# Patient Record
Sex: Female | Born: 1995 | Race: Black or African American | Hispanic: No | Smoking: Former smoker
Health system: Southern US, Community
[De-identification: ages and names within clinical notes are randomized; demographics above are authoritative.]

## PROBLEM LIST (undated history)

## (undated) DIAGNOSIS — J45909 Unspecified asthma, uncomplicated: Secondary | ICD-10-CM

---
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
## 2013-12-27 ENCOUNTER — Other Ambulatory Visit: Payer: Self-pay | Admitting: Internal Medicine

## 2013-12-27 ENCOUNTER — Ambulatory Visit
Admission: RE | Admit: 2013-12-27 | Discharge: 2013-12-27 | Disposition: A | Discharge status: Home / Self Care | Payer: Federal, State, Local not specified - PPO | Source: Ambulatory Visit | Attending: Internal Medicine | Admitting: Internal Medicine

## 2013-12-27 DIAGNOSIS — R109 Unspecified abdominal pain: Secondary | ICD-10-CM

## 2013-12-27 DIAGNOSIS — K219 Gastro-esophageal reflux disease without esophagitis: Secondary | ICD-10-CM

## 2013-12-28 ENCOUNTER — Ambulatory Visit
Admission: RE | Admit: 2013-12-28 | Discharge: 2013-12-28 | Disposition: A | Discharge status: Home / Self Care | Payer: Federal, State, Local not specified - PPO | Source: Ambulatory Visit | Attending: Internal Medicine | Admitting: Internal Medicine

## 2013-12-28 DIAGNOSIS — R109 Unspecified abdominal pain: Secondary | ICD-10-CM

## 2013-12-28 DIAGNOSIS — K219 Gastro-esophageal reflux disease without esophagitis: Secondary | ICD-10-CM

## 2014-10-13 ENCOUNTER — Other Ambulatory Visit: Payer: Self-pay | Admitting: Obstetrics and Gynecology

## 2014-10-13 DIAGNOSIS — E049 Nontoxic goiter, unspecified: Secondary | ICD-10-CM

## 2014-10-19 ENCOUNTER — Other Ambulatory Visit: Payer: Federal, State, Local not specified - PPO

## 2015-02-02 ENCOUNTER — Ambulatory Visit (INDEPENDENT_AMBULATORY_CARE_PROVIDER_SITE_OTHER): Payer: Federal, State, Local not specified - PPO | Admitting: Neurology

## 2015-02-02 ENCOUNTER — Encounter: Payer: Self-pay | Admitting: Neurology

## 2015-02-02 VITALS — BP 112/75 | HR 83 | Ht 65.5 in | Wt 270.0 lb

## 2015-02-02 DIAGNOSIS — R519 Headache, unspecified: Secondary | ICD-10-CM

## 2015-02-02 DIAGNOSIS — R51 Headache: Secondary | ICD-10-CM | POA: Diagnosis not present

## 2015-02-02 MED ORDER — NORTRIPTYLINE HCL 10 MG PO CAPS: 20.0000 mg | ORAL_CAPSULE | Freq: Every day | ORAL | Status: AC

## 2015-02-02 MED ORDER — TIZANIDINE HCL 4 MG PO TABS: 4.0000 mg | ORAL_TABLET | Freq: Three times a day (TID) | ORAL | Status: AC | PRN

## 2015-02-02 NOTE — Progress Notes (Signed)
[facility]    Provider:  Dr Lucia GaskinsAhern Referring Provider: Jaymes Graffillard, Naima, MD Primary Care Physician:  No primary care provider on file.  CC:  headaches  HPI:  Candice Browning is a 19 y.o. female here as a referral from Dr. Normand Sloopillard for headaches  Headaches for years. Headaches are different places in the head, back of the head, behind eyes, temples. Depends. They are all different. Pressure behind the eyes, massaging the back of thead helps, has  tightness. Has them every day. Up to 24 hours a day. No time of day preference. These ar her normal headaches. No associated vision changes, or hearing changes. Not positional. She has to wear sunglasses outside. No sound sensitivity. No other focal neurologic deficits. Has gained 30 pounds in the last 6 months. Hard to get to sleep. Avg 6 hours. Worsening.  Review of Systems: Patient complains of symptoms per HPI as well as the following symptoms: swelling in legs, feeling hot, feeling thirsty, cramps, diarrhea, constipation, allergies, runny nose, depression, decreased energy, disinterest in activities. Pertinent negatives per HPI. All others negative.   History   Social History  . Marital Status: Unknown    Spouse Name: N/A  . Number of Children: 0  . Years of Education: Some colle   Occupational History  . Full time student    Social History Main Topics  . Smoking status: Former Smoker    Quit date: 04/06/2013  . Smokeless tobacco: Not on file  . Alcohol Use: 0.0 oz/week    0 Standard drinks or equivalent per week     Comment: 3-5 drinks weekly   . Drug Use: No  . Sexual Activity: Not on file   Other Topics Concern  . Not on file   Social History Narrative   Lives at college with two roommates   Caffeine use: 1-2 starbucks drinks daily     Family History  Problem Relation Age of Onset  . Diabetes    . Multiple sclerosis    . High blood pressure      History reviewed. No pertinent past medical  history.  History reviewed. No pertinent past surgical history.  Current Outpatient Prescriptions  Medication Sig Dispense Refill  . fluticasone (FLONASE) 50 MCG/ACT nasal spray Place 1 spray into both nostrils daily.    Marland Kitchen. Fexofenadine HCl (ALLEGRA PO) Take by mouth.    . Omeprazole (PRILOSEC PO) Take by mouth.     No current facility-administered medications for this visit.    Allergies as of 02/02/2015 - Review Complete 02/02/2015  Allergen Reaction Noted  . Iodine  02/02/2015  . Mold extract [trichophyton]  02/02/2015  . Other  02/02/2015  . Pollen extract  02/02/2015    Vitals: Ht 5' 5.5" (1.664 m)  Wt 270 lb (122.471 kg)  BMI 44.23 kg/m2 Last Weight:  Wt Readings from Last 1 Encounters:  02/02/15 270 lb (122.471 kg)   Last Height:   Ht Readings from Last 1 Encounters:  02/02/15 5' 5.5" (1.664 m)   Physical exam: Exam: Gen: NAD, conversant, well nourised, obese, well groomed                     CV: RRR, no MRG. No Carotid Bruits. No peripheral edema, warm, nontender Eyes: Conjunctivae clear without exudates or hemorrhage  Neuro: Detailed Neurologic Exam  Speech:    Speech is normal; fluent and spontaneous with normal comprehension.  Cognition:    The patient is oriented to person, place,  and time;     recent and remote memory intact;     language fluent;     normal attention, concentration,     fund of knowledge Cranial Nerves:    The pupils are equal, round, and reactive to light. The fundi are normal and spontaneous venous pulsations are present. Visual fields are full to finger confrontation. Extraocular movements are intact. Trigeminal sensation is intact and the muscles of mastication are normal. The face is symmetric. The palate elevates in the midline. Hearing intact. Voice is normal. Shoulder shrug is normal. The tongue has normal motion without fasciculations.   Coordination:    Normal finger to nose and heel to shin. Normal rapid alternating  movements.   Gait:    Heel-toe and tandem gait are normal.   Motor Observation:    No asymmetry, no atrophy, and no involuntary movements noted. Tone:    Normal muscle tone.    Posture:    Posture is normal. normal erect    Strength:    Strength is V/V in the upper and lower limbs.      Sensation: intact to LT     Reflex Exam:  DTR's:    Deep tendon reflexes in the upper and lower extremities are normal bilaterally.   Toes:    The toes are downgoing bilaterally.   Clonus:    Clonus is absent.       Assessment/Plan:  19 year old female with headaches. Chronic daily headaches. Neurologic exam normal. No other associated focal neurologic deficits. Morbid obesity.  Will start nortriptylline  qhs Tizanidine Both can be sedating, discussed risks, do not drive Advised follow up for depression Use birth control  Naomie Dean, MD  [facility] [address] [place], [place] [address]  Phone [phone] Fax [phone]

## 2015-02-02 NOTE — Patient Instructions (Signed)
Overall you are doing fairly well but I do want to suggest a few things today:   Remember to drink plenty of fluid, eat healthy meals and do not skip any meals. Try to eat protein with a every meal and eat a healthy snack such as fruit or nuts in between meals. Try to keep a regular sleep-wake schedule and try to exercise daily, particularly in the form of walking, 20-30 minutes a day, if you can.   As far as your medications are concerned, I would like to suggest:  Nortriptyline. Start with 10mg  at night, ca increase to 20mg  in 2 weeks if needed. Tizanidine as prescribed for headache or neck pain  As far as diagnostic testing: CT of the head  I would like to see you back in 3 months, sooner if we need to. Please call us with any interim questions, concerns, problems, updates or refill requests.   Please also call us for any test results so we can go over those with you on the phone.  My clinical assistant and will answer any of your questions and relay your messages to me and also relay most of my messages to you.   Our phone number is [phone]. We also have an after hours call service for urgent matters and there is a physician on-call for urgent questions. For any emergencies you know to call 911 or go to the nearest emergency room

## 2015-05-01 ENCOUNTER — Ambulatory Visit: Payer: Federal, State, Local not specified - PPO | Admitting: Neurology

## 2015-05-02 ENCOUNTER — Encounter: Payer: Self-pay | Admitting: Neurology

## 2015-08-14 ENCOUNTER — Emergency Department (INDEPENDENT_AMBULATORY_CARE_PROVIDER_SITE_OTHER)
Admission: EM | Admit: 2015-08-14 | Discharge: 2015-08-14 | Disposition: A | Discharge status: Home / Self Care | Payer: Federal, State, Local not specified - PPO | Source: Home / Self Care

## 2015-08-14 ENCOUNTER — Encounter (HOSPITAL_COMMUNITY): Payer: Self-pay | Admitting: Emergency Medicine

## 2015-08-14 ENCOUNTER — Emergency Department (INDEPENDENT_AMBULATORY_CARE_PROVIDER_SITE_OTHER): Payer: Federal, State, Local not specified - PPO

## 2015-08-14 DIAGNOSIS — S022XXA Fracture of nasal bones, initial encounter for closed fracture: Secondary | ICD-10-CM

## 2015-08-14 HISTORY — DX: Unspecified asthma, uncomplicated: J45.909

## 2015-08-14 MED ORDER — HYDROCODONE-ACETAMINOPHEN 5-325 MG PO TABS: 1.0000 | ORAL_TABLET | ORAL | Status: AC | PRN

## 2015-08-14 MED ORDER — HYDROCODONE-ACETAMINOPHEN 5-325 MG PO TABS
1.0000 | ORAL_TABLET | Freq: Once | ORAL | Status: AC
  Administered 2015-08-14: 1 via ORAL

## 2015-08-14 MED ORDER — HYDROCODONE-ACETAMINOPHEN 5-325 MG PO TABS
ORAL_TABLET | ORAL | Status: AC
  Filled 2015-08-14: qty 1
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
## 2015-12-09 ENCOUNTER — Encounter (HOSPITAL_COMMUNITY): Payer: Self-pay | Admitting: Emergency Medicine

## 2015-12-09 ENCOUNTER — Emergency Department (INDEPENDENT_AMBULATORY_CARE_PROVIDER_SITE_OTHER): Payer: Federal, State, Local not specified - PPO

## 2015-12-09 ENCOUNTER — Emergency Department (HOSPITAL_COMMUNITY)
Admission: EM | Admit: 2015-12-09 | Discharge: 2015-12-09 | Disposition: A | Discharge status: Home / Self Care | Payer: Federal, State, Local not specified - PPO | Source: Home / Self Care | Attending: Emergency Medicine | Admitting: Emergency Medicine

## 2015-12-09 DIAGNOSIS — S99922A Unspecified injury of left foot, initial encounter: Secondary | ICD-10-CM

## 2015-12-09 DIAGNOSIS — M542 Cervicalgia: Secondary | ICD-10-CM
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
## 2015-12-09 NOTE — Discharge Instructions (Signed)
Review of your XR does not reveal any acute fracture.  Suggested treatment for your foot are cold compresses along with tylenol or ibuprofen.   You neck complaint is in the muscle and suggested treatment is warm compresses and either tylenol or ibuprofen  Activity as tolerated.   Return if there are new or worsening of your symptoms.

## 2015-12-09 NOTE — ED Provider Notes (Signed)
CSN: [id number]     Arrival date & time 12/09/15  1746 History   First MD Initiated Contact with Patient 12/09/15 1909     Chief Complaint  Patient presents with  . Toe Injury   (Consider location/radiation/quality/duration/timing/severity/associated sxs/prior Treatment) HPI  Past Medical History  Diagnosis Date  . Asthma    History reviewed. No pertinent past surgical history. Family History  Problem Relation Age of Onset  . Diabetes    . Multiple sclerosis    . High blood pressure     Social History  Substance Use Topics  . Smoking status: Former Smoker    Quit date: 04/06/2013  . Smokeless tobacco: None  . Alcohol Use: 0.0 oz/week    0 Standard drinks or equivalent per week     Comment: 3-5 drinks weekly    OB History    No data available     Review of Systems  Allergies  Iodine; Mold extract; Other; and Pollen extract  Home Medications   Prior to Admission medications   Medication Sig Start Date End Date Taking? Authorizing Provider  Fexofenadine HCl (ALLEGRA PO) Take by mouth.   Yes Historical Provider, MD  fluticasone (FLONASE) 50 MCG/ACT nasal spray Place 1 spray into both nostrils daily.   Yes Historical Provider, MD  HYDROcodone-acetaminophen (NORCO/VICODIN) 5-325 MG tablet Take 1 tablet by mouth every 4 (four) hours as needed. 08/14/15   Deatra Canter, FNP  nortriptyline (PAMELOR) 10 MG capsule Take 2 capsules (20 mg total) by mouth at bedtime. 02/02/15   Anson Fret, MD  Omeprazole (PRILOSEC PO) Take by mouth.    Historical Provider, MD  tiZANidine (ZANAFLEX) 4 MG tablet Take 1 tablet (4 mg total) by mouth every 8 (eight) hours as needed for muscle spasms. 02/02/15   Anson Fret, MD  valACYclovir (VALTREX) 1000 MG tablet Take 1 tablet by mouth every 12 (twelve) hours. For 7 days. 01/09/15   Historical Provider, MD   Meds Ordered and Administered this Visit  Medications - No data to display  BP 105/70 mmHg  Pulse 85  Temp(Src) 98 F (36.7 C)  (Oral)  Resp 16  SpO2 98% No data found.   Physical Exam  Constitutional: She is oriented to person, place, and time.  Neck: Normal range of motion and full passive range of motion without pain. Muscular tenderness present. No rigidity. Normal range of motion present.    Musculoskeletal: She exhibits tenderness.       Left ankle: She exhibits normal range of motion.       Left foot: There is tenderness. There is no swelling, no crepitus, no deformity and no laceration.       Feet:  Neurological: She is alert and oriented to person, place, and time.  Skin: Skin is warm and dry.  Psychiatric: She has a normal mood and affect. Her behavior is normal.  Nursing note and vitals reviewed.   ED Course  Procedures (including critical care time)  Labs Review Labs Reviewed - No data to display  Imaging Review Dg Foot Complete Left  12/09/2015  CLINICAL DATA:  Person stepped on foot last night. Foot pain mainly in region of second and third toes. Initial encounter. EXAM: LEFT FOOT - COMPLETE 3+ VIEW COMPARISON:  None. FINDINGS: There is no evidence of fracture or dislocation. Mild to moderate hallux valgus noted. No other significant bone abnormality identified. IMPRESSION: No acute findings.  Hallux valgus. Electronically Signed   By: Alver Sorrow.D.  On: 12/09/2015 19:41     Visual Acuity Review  Right Eye Distance:   Left Eye Distance:   Bilateral Distance:    Right Eye Near:   Left Eye Near:    Bilateral Near:       Review of xr with patient no fracture Suggest symptomatic treatment:  MDM   1. Injury of toe on left foot, initial encounter   2. Neck pain on right side    Patient is reassured that there is no indication for more advance testing at this time.  Patient is advised to continue home symptomatic treatment.  Patient is advised that if there are new or worsening symptoms or attend the emergency department, or contact primary care provider. Instructions of care  provided discharged home in stable condition. Return to work/school note provided.  THIS NOTE WAS GENERATED USING A VOICE RECOGNITION SOFTWARE PROGRAM. ALL REASONABLE EFFORTS  WERE MADE TO PROOFREAD THIS DOCUMENT FOR ACCURACY.     Tharon AquasFrank C Patrick, PA 12/09/15 2118
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
## 2017-11-07 IMAGING — DX DG FOOT COMPLETE 3+V*L*
3 series · 3 of 3 positions shown · non-contrast
Comparison: None.

CLINICAL DATA: Person stepped on foot last night. Foot pain mainly
in region of second and third toes. Initial encounter.

EXAM:
LEFT FOOT - COMPLETE 3+ VIEW

[foot ap]
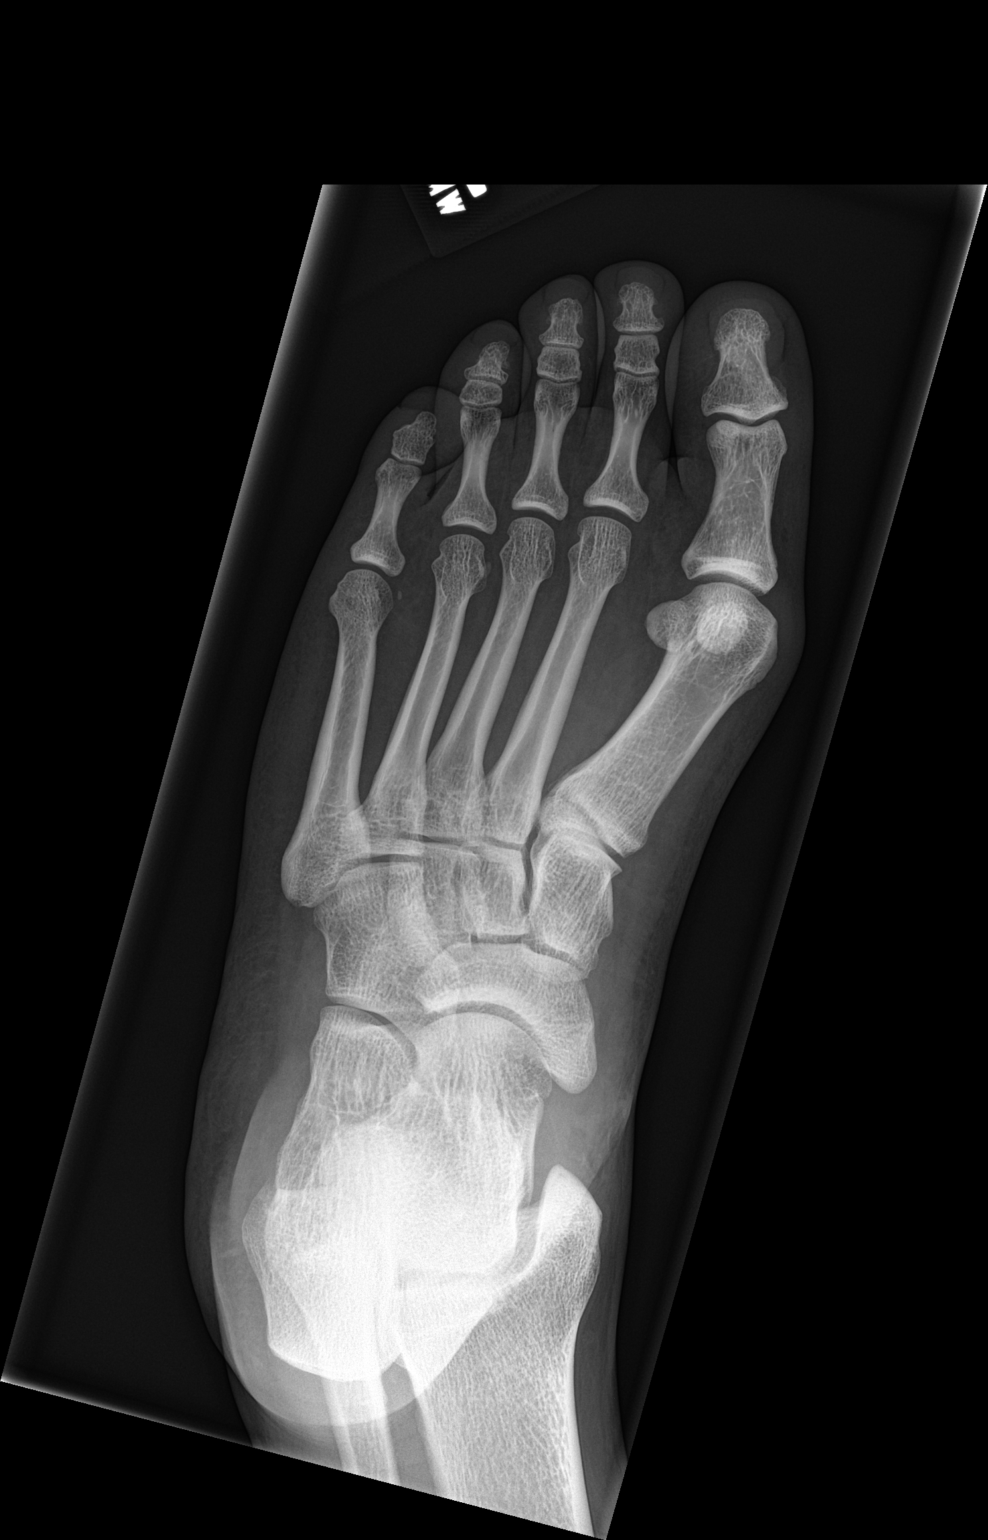

[foot obl]
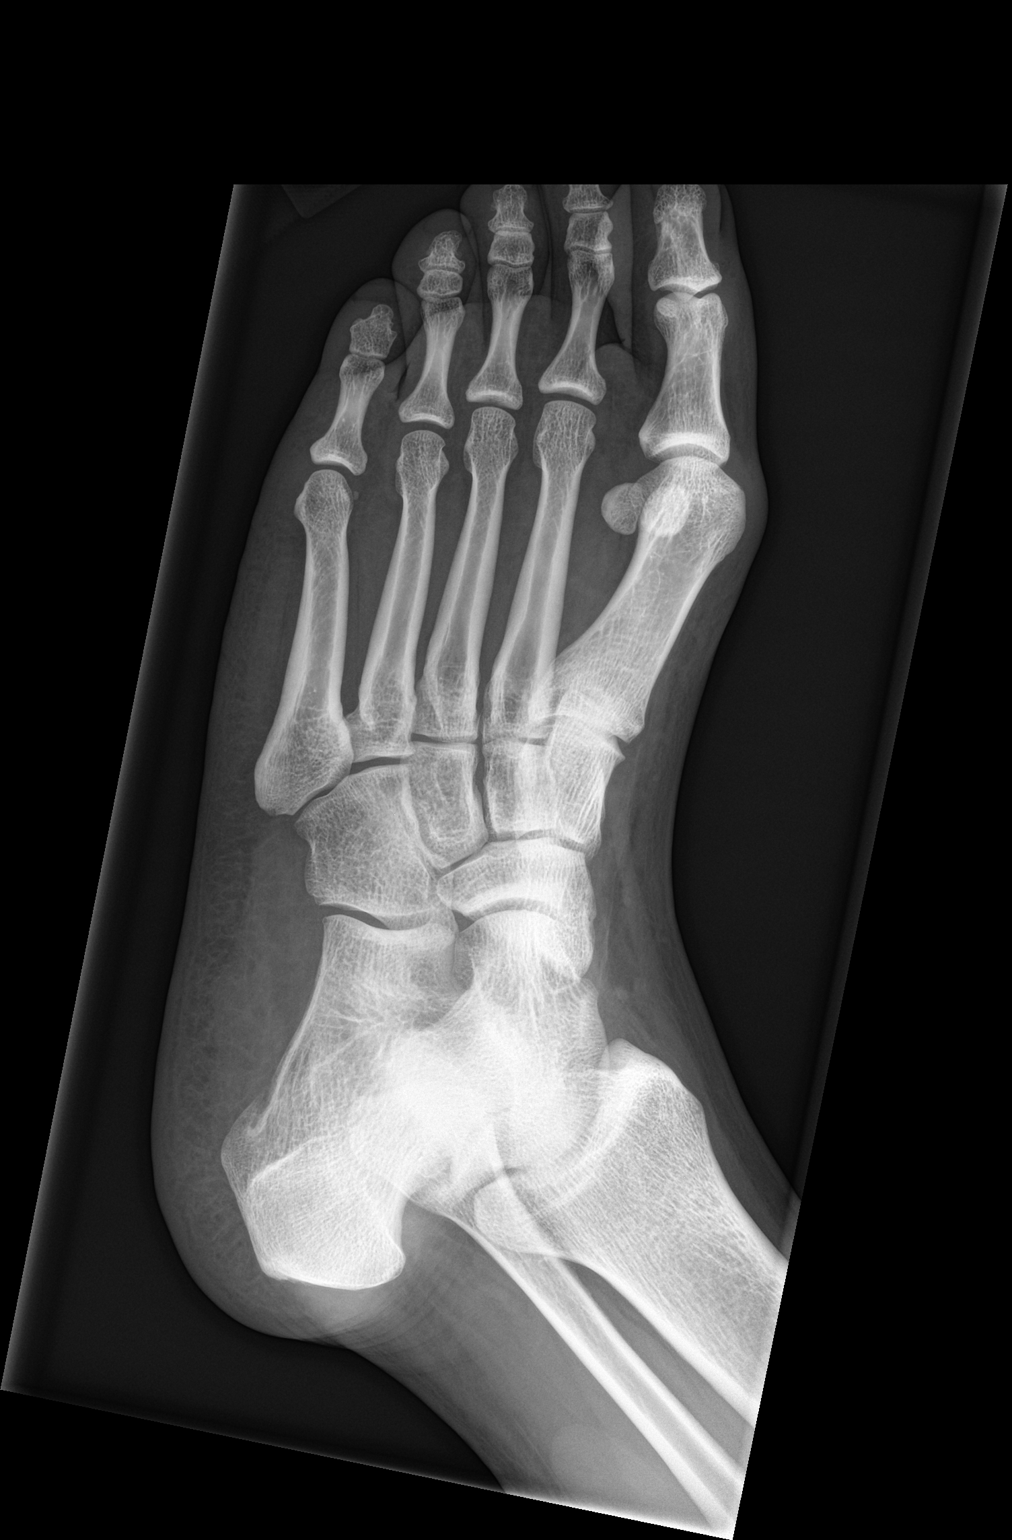

[foot lat]
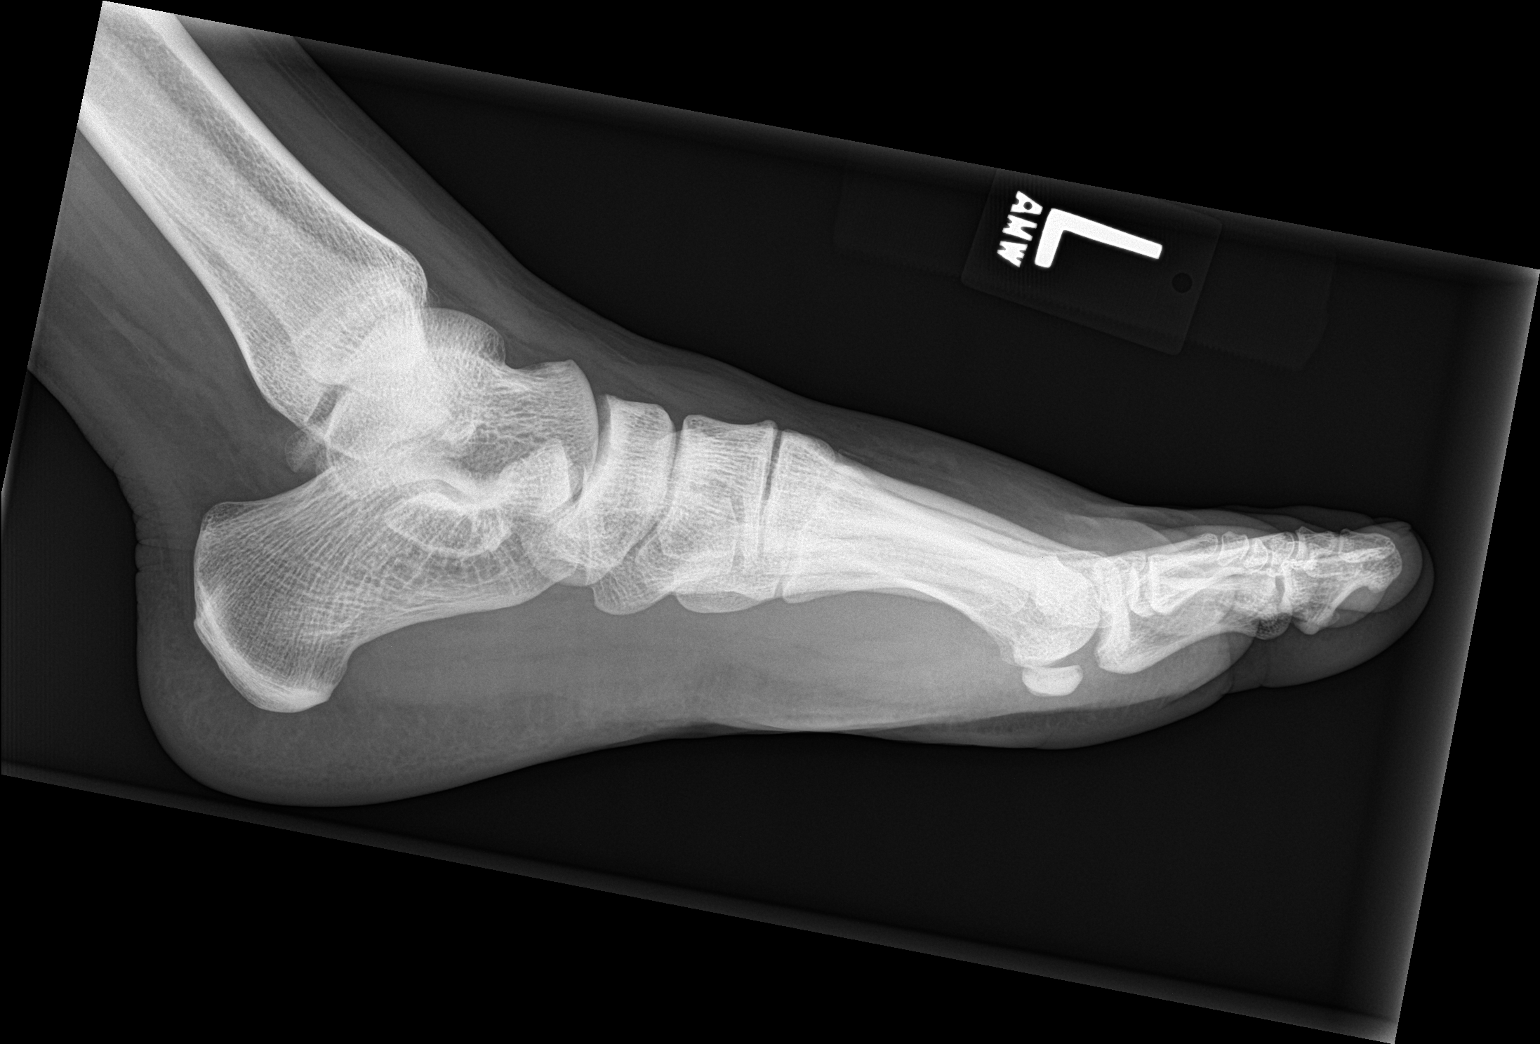

[3 of 3 positions shown; findings below may reference images not displayed]

FINDINGS: There is no evidence of fracture or dislocation. Mild to moderate
hallux valgus noted. No other significant bone abnormality
identified.
IMPRESSION: No acute findings.  Hallux valgus.
